# Patient Record
Sex: Male | Born: 2005 | Race: Black or African American | Hispanic: No | Marital: Single
Health system: Southern US, Community
[De-identification: ages and names within clinical notes are randomized; demographics above are authoritative.]

## PROBLEM LIST (undated history)

## (undated) DIAGNOSIS — J45909 Unspecified asthma, uncomplicated: Secondary | ICD-10-CM

---
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
## 2024-05-09 NOTE — ED Triage Notes (Addendum)
 [place] [facility] [place]. Pt reports having seizure auras and felt one at the gas station. States it doesn't feel as intense now; does admit to having a seizure yesterday. Takes Lamictal 150mg  BID; started it last week. Pt also reports wanting a mental health eval due to just being released from prison; denies SI/HI. VSS; G[facility] 15

## 2024-05-10 ENCOUNTER — Other Ambulatory Visit: Payer: Self-pay

## 2024-05-10 ENCOUNTER — Emergency Department (HOSPITAL_COMMUNITY)
Admission: EM | Admit: 2024-05-10 | Discharge: 2024-05-10 | Disposition: A | Discharge status: Home / Self Care | Payer: Self-pay | Attending: Emergency Medicine | Admitting: Emergency Medicine

## 2024-05-10 DIAGNOSIS — R569 Unspecified convulsions: Secondary | ICD-10-CM | POA: Insufficient documentation

## 2024-05-10 NOTE — Discharge Instructions (Signed)
 Thank you for letting us  take care of you today.  You came in today for evaluation of seizures.  We did an exam that was reassuring.  We think you do need to see a neurologist.  It seemed that you are having breakthrough seizures despite your ongoing medications.  You did have laboratory evaluation previously that did show your medications were at therapeutic levels.  Please come back to the emergency department if you have recurrent seizures, chest pain, shortness of breath, high fevers.

## 2024-05-10 NOTE — ED Provider Notes (Signed)
 "  EMERGENCY DEPARTMENT AT [facility] Provider Note   CSN: [id number] Arrival date & time: 05/10/24  1950     Patient presents with: Seizures   Joel Mayo is a 19 y.o. male who presents today for evaluation of breakthrough seizures.  Patient reports that he has a known history of seizures since he was 18 years old.  He was previously on Depakote  however is now taking Lamictal.  Patient reports that he had a breakthrough seizure earlier today and continues to have the sensation that he may have another seizure and is presenting today for further evaluation.  Patient reports that he is otherwise been in his normal state of health without any fevers, chills, cough, congestion.  He denies any chest pain or shortness of breath.  No vomiting or diarrhea.  Patient denies any drugs or alcohol.  Patient reports that has been compliant with his medication.    Seizures      Prior to Admission medications  Medication Sig Start Date End Date Taking? Authorizing Provider  cetirizine  (ZYRTEC ) 10 MG tablet Take 1 tablet (10 mg total) by mouth daily as needed for allergies. 08/04/21   Sharlot Iha, MD  fluticasone  (FLONASE ) 50 MCG/ACT nasal spray Place 2 sprays into both nostrils daily. Patient taking differently: Place 2 sprays into both nostrils daily as needed for allergies. 08/04/21   Sharlot Iha, MD  hydrOXYzine  (ATARAX ) 25 MG tablet Take 1 tablet (25 mg total) by mouth every 6 (six) hours as needed. Patient taking differently: Take 25 mg by mouth every 6 (six) hours as needed for anxiety. 07/29/21   Donzetta Bernardino PARAS, MD  QUEtiapine  (SEROQUEL  XR) 50 MG TB24 24 hr tablet Take 2 tablets by mouth at bedtime. 07/29/21   Reichert, Bernardino PARAS, MD  QUEtiapine  (SEROQUEL ) 50 MG tablet Take 1 tablet (50 mg total) by mouth 2 (two) times daily. Patient not taking: Reported on 08/19/2021 10/23/20 08/19/21  Carmelia Erma SAUNDERS, NP  Vitamin D , Ergocalciferol , (DRISDOL ) 1.25 MG (50000 UNIT)  CAPS capsule Take 1 capsule by mouth every 7 days. Patient taking differently: Take 50,000 Units by mouth every Tuesday. 07/13/21   Conklin, Erica R, PA-C  benztropine  (COGENTIN ) 1 MG tablet Take 0.5 tablets (0.5 mg total) by mouth 2 (two) times daily. Patient not taking: No sig reported 07/25/20 10/22/20  Taft Jon PARAS, MD  divalproex  (DEPAKOTE ) 500 MG DR tablet Take 1 tablet (500 mg total) by mouth 2 (two) times daily. Patient not taking: No sig reported 07/25/20 10/22/20  Stanley, Angela J, MD  lithium  300 MG tablet Take 2 tablets (600 mg total) by mouth in the morning and at bedtime. Patient not taking: No sig reported 07/25/20 10/22/20  Taft Jon PARAS, MD  mirtazapine  (REMERON ) 30 MG tablet Take 1 tablet (30 mg total) by mouth at bedtime. Patient not taking: No sig reported 07/25/20 10/22/20  Taft Jon PARAS, MD  OLANZapine  (ZYPREXA ) 5 MG tablet Take 1 tablet (5 mg total) by mouth in the morning, at noon, and at bedtime. 07/25/20 10/22/20  Taft Jon PARAS, MD  PROVENTIL  HFA 108 (90 Base) MCG/ACT inhaler Inhale 2 puffs into the lungs every 6 (six) hours as needed for wheezing or shortness of breath. Patient not taking: No sig reported 07/17/20 10/22/20  Arletha High, MD    Allergies: Patient has no known allergies.    Review of Systems  Neurological:  Positive for seizures.    Updated Vital Signs BP 111/76 (BP Location: Left Arm)  Pulse 81   Temp 98.2 F (36.8 C) (Oral)   Resp 16   Ht 6' 5 (1.956 m)   Wt 86.2 kg   SpO2 100%   BMI 22.53 kg/m   Physical Exam HENT:     Head: Normocephalic.     Right Ear: External ear normal.     Left Ear: External ear normal.     Mouth/Throat:     Mouth: Mucous membranes are moist.  Eyes:     Pupils: Pupils are equal, round, and reactive to light.  Cardiovascular:     Rate and Rhythm: Normal rate and regular rhythm.     Pulses: Normal pulses.  Pulmonary:     Effort: Pulmonary effort is normal.     Breath sounds: Normal breath sounds.   Abdominal:     General: Abdomen is flat.  Musculoskeletal:        General: Normal range of motion.     Cervical back: Normal range of motion.  Skin:    General: Skin is warm.     Capillary Refill: Capillary refill takes less than 2 seconds.  Neurological:     General: No focal deficit present.     Mental Status: He is alert and oriented to person, place, and time.     Cranial Nerves: No cranial nerve deficit.     Sensory: No sensory deficit.     Motor: No weakness.  Psychiatric:        Mood and Affect: Mood normal.     (all labs ordered are listed, but only abnormal results are displayed) Labs Reviewed - No data to display  EKG: None  Radiology: No results found.                               Medical Decision Making  Patient is a 19 year old male who presents today for evaluation of breakthrough seizures.  On initial assessment patient was noted to be hemodynamically stable and afebrile.  On my bedside assessment patient was notably resting comfortably no acute distress.  On my physical examination patient was noted to be sleeping comfortably however easily arousable to verbal stimuli otherwise appropriately interactive on my assessment.  Patient has no focal neurologic deficits on my assessment.  No dysmetria.  On my chart review patient has no prior evaluation by neurology and I can see normal formal documentation of seizures.  Patient does have medications in his bag for Lamictal and reports that he has been compliant.  It appears that his presentation may be related to breakthrough seizures.  Patient otherwise without any symptoms concerning for potential underlying metabolic derangement or significant infectious process that may be contributing.  No recent trauma that be worrisome for ICH, skull fracture.  No evidence of acute intoxication or withdrawal.  Based on clinical history and my examination I have low concerns at this time for additional intracranial process such  as malignancy.  Patient does not warrant laboratory evaluation or imaging studies at this time.  Patient was evaluated earlier this month for evaluation of similar with unremarkable laboratory evaluation and Lamictal level within therapeutic range.  I will provide an outpatient neurology follow-up so we can undergo further medical optimization.  Return precautions discussed.    Final diagnoses:  Seizure [facility])    ED Discharge Orders          Ordered    Ambulatory referral to Neurology       Comments:  An appointment is requested in approximately: 2 weeks   05/10/24 2154               Laurita Sieving, MD 05/10/24 2223    Garrick Charleston, MD 05/11/24 [phone]  "

## 2024-05-10 NOTE — ED Triage Notes (Signed)
 Patient BIB [facility] with concerns of seizure. [place] and had [place] seizure. Patient denies injury of time of seizure. Patient was not checked out after seizure. Patient now continues to have auras without seizures and wants to be checked out for safety precautions.
